# Patient Record
Sex: Male | Born: 1965 | Race: White | Hispanic: No | Marital: Married | Smoking: Never smoker
Health system: Southern US, Community
[De-identification: ages and names within clinical notes are randomized; demographics above are authoritative.]

## PROBLEM LIST (undated history)

## (undated) DIAGNOSIS — I4892 Unspecified atrial flutter: Secondary | ICD-10-CM

## (undated) DIAGNOSIS — G43909 Migraine, unspecified, not intractable, without status migrainosus: Secondary | ICD-10-CM

## (undated) DIAGNOSIS — I10 Essential (primary) hypertension: Secondary | ICD-10-CM

## (undated) DIAGNOSIS — I499 Cardiac arrhythmia, unspecified: Secondary | ICD-10-CM

## (undated) HISTORY — DX: Unspecified atrial flutter: I48.92

## (undated) HISTORY — PX: OTHER SURGICAL HISTORY: SHX169

## (undated) HISTORY — DX: Cardiac arrhythmia, unspecified: I49.9

## (undated) HISTORY — DX: Migraine, unspecified, not intractable, without status migrainosus: G43.909

## (undated) HISTORY — DX: Essential (primary) hypertension: I10

---

## 2004-01-15 ENCOUNTER — Encounter: Payer: Self-pay | Admitting: Cardiology

## 2004-01-16 ENCOUNTER — Encounter: Payer: Self-pay | Admitting: Cardiology

## 2006-05-27 ENCOUNTER — Emergency Department (HOSPITAL_COMMUNITY): Admission: EM | Admit: 2006-05-27 | Discharge: 2006-05-27 | Payer: Self-pay | Admitting: Emergency Medicine

## 2007-02-03 ENCOUNTER — Ambulatory Visit: Payer: Self-pay | Admitting: Cardiology

## 2007-02-16 ENCOUNTER — Encounter: Payer: Self-pay | Admitting: Cardiology

## 2007-02-16 ENCOUNTER — Ambulatory Visit: Payer: Self-pay | Admitting: Cardiology

## 2007-02-23 IMAGING — CT CT CERVICAL SPINE W/O CM
1 of 7 series · 3 of 14 positions shown, 4 images · IV contrast (agent unspecified)
Comparison: none

CLINICAL DATA: 40-year-old male.  Head trauma, headache and neck pain.
 HEAD CT WITHOUT CONTRAST:
TECHNIQUE: Contiguous axial images were obtained from the base of the skull through the vertex according to standard protocol without contrast.
TECHNIQUE: Multidetector CT imaging of the cervical spine was performed.  Multiplanar CT image reconstructions were also generated.

[Series 6: recon 2: cervical spine · axial · 0.31mm/px · z∈[+62,+257]mm · 3 of 79 slices shown, 4 images]
[im 1/79  soft-tissue]
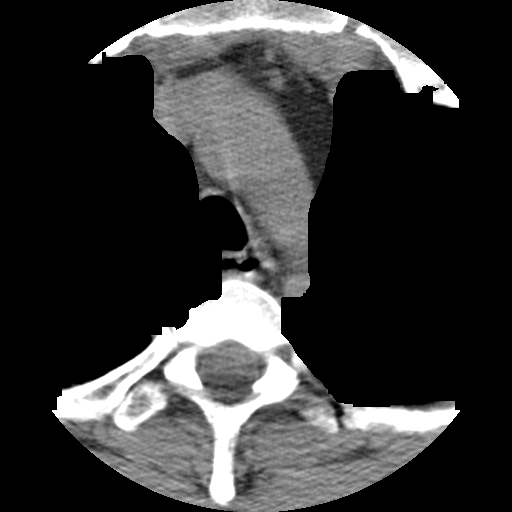
[im 1/79  bone]
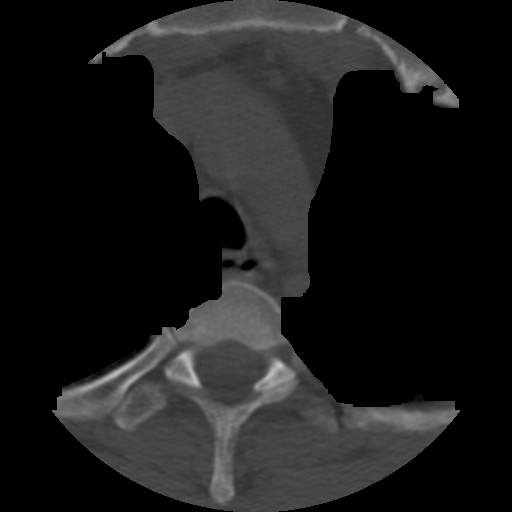
[im 40/79  bone]
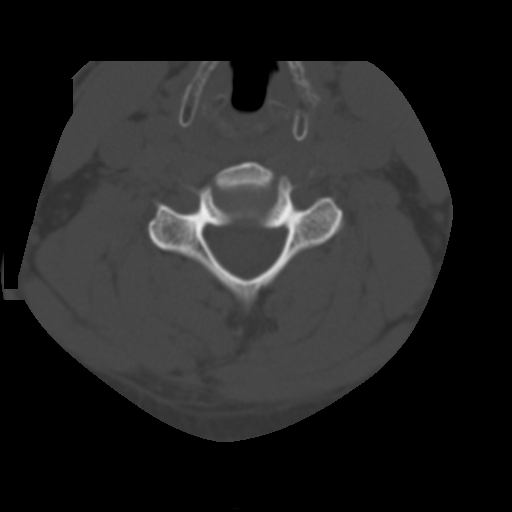
[im 79/79  bone]
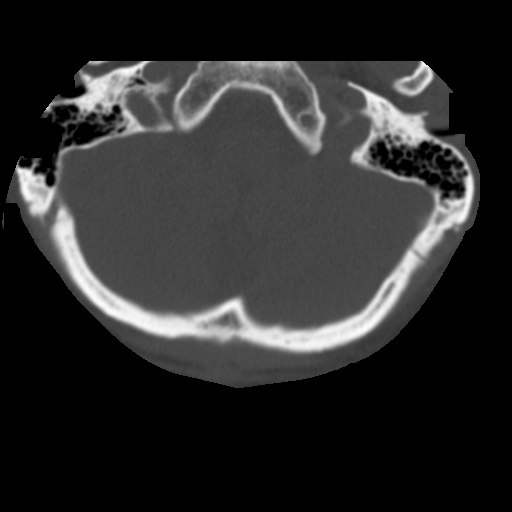

[3 of 14 positions shown; findings below may reference images not displayed]

FINDINGS: There is no evidence of intracranial hemorrhage, brain edema, acute infarct, mass lesion, or mass effect.  No other intra-axial abnormalities are seen, and the ventricles are within normal limits.  No abnormal extra-axial fluid collections or masses are identified. No skull abnormalities are noted.
IMPRESSION: Negative non-contrast head CT.
 CERVICAL SPINE CT WITHOUT CONTRAST:
FINDINGS: Cervical spine alignment is somewhat straightened with minimal spondylosis and degenerative disc disease at C5-6.  No acute compression fracture, subluxation, or dislocation.  Facets are aligned.  Foramina are patent.  Prevertebral soft tissues are normal. 
 Intact odontoid.
IMPRESSION: 1.  No acute fracture or static injury of the cervical spine by CT. 
 2.  Mild C5-6 degenerative disc disease and spondylosis.

## 2010-09-29 ENCOUNTER — Telehealth (INDEPENDENT_AMBULATORY_CARE_PROVIDER_SITE_OTHER): Payer: Self-pay | Admitting: *Deleted

## 2010-10-02 ENCOUNTER — Ambulatory Visit: Payer: Self-pay | Admitting: Cardiology

## 2010-10-02 ENCOUNTER — Encounter: Payer: Self-pay | Admitting: Cardiology

## 2010-10-02 DIAGNOSIS — I959 Hypotension, unspecified: Secondary | ICD-10-CM

## 2010-10-02 DIAGNOSIS — I4892 Unspecified atrial flutter: Secondary | ICD-10-CM

## 2010-10-02 DIAGNOSIS — R55 Syncope and collapse: Secondary | ICD-10-CM

## 2010-10-02 DIAGNOSIS — G43909 Migraine, unspecified, not intractable, without status migrainosus: Secondary | ICD-10-CM | POA: Insufficient documentation

## 2010-10-02 DIAGNOSIS — I10 Essential (primary) hypertension: Secondary | ICD-10-CM | POA: Insufficient documentation

## 2010-10-02 DIAGNOSIS — R079 Chest pain, unspecified: Secondary | ICD-10-CM

## 2010-10-08 ENCOUNTER — Ambulatory Visit: Payer: Self-pay | Admitting: Cardiology

## 2010-10-11 ENCOUNTER — Encounter: Payer: Self-pay | Admitting: Physician Assistant

## 2010-10-15 ENCOUNTER — Encounter: Payer: Self-pay | Admitting: Cardiology

## 2010-10-23 ENCOUNTER — Ambulatory Visit: Payer: Self-pay | Admitting: Physician Assistant

## 2010-11-27 NOTE — Assessment & Plan Note (Signed)
Summary: 3 WK F/U PER 12/8 OV W/[name] [name]   Visit Type:  Follow-up Primary Provider:  [name],MD   History of Present Illness: patient returns for scheduled early followup, per [name] [name].  He was recently seen here in the office for evaluation of CP, with no known history of CAD. He has previously documented atrial flutter, status post [facility], previously followed by [name] [name].   His symptoms were felt to be atypical, lasting only a few seconds in duration. He did complain of DOE, however, and was referred for a 2-D echo. He was noted to have had a normal stress echo in 2008. He was also referred for a 48-hour Holter monitor, and placed back on low-dose Toprol, which he had taken back in the past. This was to address his untreated HTN, as well as possible recurrent atrial flutter.  Echocardiogram indicated normal LVF (EF 55-60%), with no focal wall motion abnormalities. There was mildly dilated aortic root (39 mmHg). The Holter monitor yielded normal sinus rhythm with sinus tachycardia, and rare PACs; no atrial fibrillation/flutter.   Clinically, the patient has not had any recurrent chest pain. He feels much better, since starting the Toprol. He has not yet started low-dose aspirin, however, given that he is scheduled to undergo left arm surgery, next Tuesday.  Preventive Screening-Counseling & Management  Alcohol-Tobacco     Smoking Status: never  Current Medications (verified): 1)  Aspirin 81 Mg Tbec (Aspirin) .... Take One Tablet By Mouth Daily 2)  Metoprolol Succinate 25 Mg Xr24h-Tab (Metoprolol Succinate) .... Take One Tablet By Mouth Daily  Allergies (verified): No Known Drug Allergies  Comments:  Nurse/Medical Assistant: [name] medications and allergies were verbally reviewed with the patient and were updated in the Medication and Allergy Lists.  Past History:  Past Medical History: Last updated: 10/02/2010 1. Atrial flutter: Patient had atrial flutter  in 3/05.  He was drinking a large amount of caffeine at the time.  He had DCCV and has had no known recurrence.  2. Chest Pain: Stress echo (4/08): 11'13", no ischemic ST changes, normal echo stress.  3. Hypertension diag in 2004 4. Migraine Headaches  Review of Systems       No fevers, chills, hemoptysis, dysphagia, melena, hematocheezia, hematuria, rash, claudication, orthopnea, pnd, pedal edema. All other systems negative.   Vital Signs:  Patient profile:   45 year old male Height:      72 inches Weight:      206 pounds Pulse rate:   66 / minute BP sitting:   135 / 80  (left arm) Cuff size:   large  Vitals Entered By: [name] [name] (October 23, 2010 2:05 PM)  Physical Exam  Additional Exam:  GEN: 45 year old male, no distress HEENT: NCAT,PERRLA,EOMI NECK: palpable pulses, no bruits; no JVD; no TM LUNGS: CTA bilaterally HEART: RRR (S1S2); no significant murmurs; no rubs; no gallops ABD: soft, NT; intact BS EXT: intact distal pulses; no edema SKIN: warm, dry MUSC: no obvious deformity NEURO: A/O (x3)     Impression & Recommendations:  Problem # 1:  CHEST PAIN-UNSPECIFIED (ICD-786.50)  no further workup indicated. Patient recently presented with atypical symptoms, with no recurrence. He denies exertional chest discomfort. Recent echo indicated normal function. He is cleared to proceed with scheduled left arm surgery next week, and is at low risk for perioperative cardiac events. I advised him to not start low-dose aspirin, pending clearance from his surgeon.  Problem # 2:  ATRIAL FLUTTER (ICD-427.32)  no  recurrence by recent 48-hour Holter monitor. Continue low-dose Toprol.  Problem # 3:  HYPERTENSION (ICD-401.9)  improved, following recent resumption of low dose Toprol. Patient advised to monitor this in the ambulatory setting, and to follow with [name] [name], in the event he develops uncontrolled HTN.  Patient Instructions: 1)  Your physician wants you to  follow-up in: 1 year. You will receive a reminder letter in the mail one-two months in advance. If you don't receive a letter, please call our office to schedule the follow-up appointment. 2)  Your physician recommends that you continue on your current medications as directed. Please refer to the Current Medication list given to you today.

## 2010-11-27 NOTE — Assessment & Plan Note (Signed)
Summary: LAST SEEN 2008-CHEST PAIN, SURGERY 10/2010-JM   Visit Type:  Follow-up Primary Provider:  [name],MD  CC:  chest pain.  History of Present Illness: 45 yo with history of atrial flutter and DCCV in 2005 presents for evaluation of chest pain.  Last Saturday evening, he had severe pain across his left chest while walking to the car.  It occurred suddenly, but it only lasted for 2-3 seconds total and completely resolved.  It has not recurred. Since that time, he has felt short of breath walking up steps or walking up a hill.  This is new for him.  He denies palpitations.  BP is quite high at 152/100 today.  He says that it runs systolic 130s-140s at home.  He used to be on Toprol XL but stopped it about a year ago.  He drank caffeine very heavily in the past and has cut back some, but is still drinking 7-8 caffeinated beverages a day.    ECG: NSR, normal  Preventive Screening-Counseling & Management  Alcohol-Tobacco     Smoking Status: never  Current Medications (verified): 1)  None  Allergies (verified): No Known Drug Allergies  Comments:  Nurse/Medical Assistant: The patient is currently on medications but does not know the name or dosage at this time. Instructed to contact our office with details. Will update medication list at that time.  Past History:  Past Medical History: 1. Atrial flutter: Patient had atrial flutter in 3/05.  He was drinking a large amount of caffeine at the time.  He had DCCV and has had no known recurrence.  2. Chest Pain: Stress echo (4/08): 11'13", no ischemic ST changes, normal echo stress.  3. Hypertension diag in 2004 4. Migraine Headaches  Family History: Adopted by his grandmother.  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   Vital Signs:  Patient profile:   45 year old male Height:      72 inches Weight:      204 pounds BMI:     27.77 Pulse rate:   80 / minute BP sitting:   152 / 100  (left arm) Cuff size:   large  Vitals Entered By: Carlye Grippe (October 02, 2010 2:07 PM)  Nutrition Counseling: Patient's BMI is greater than 25 and therefore counseled on weight management options. CC: chest pain   Physical Exam  General:  Well developed, well nourished, in no acute distress. Head:  normocephalic and atraumatic Nose:  no deformity, discharge, inflammation, or lesions Mouth:  Teeth, gums and palate normal. Oral mucosa normal. Neck:  Neck supple, no JVD. No masses, thyromegaly or abnormal cervical nodes. Lungs:  Clear bilaterally to auscultation and percussion. Heart:  Non-displaced PMI, chest non-tender; regular rate and rhythm, S1, S2 without murmurs, rubs or gallops. Carotid upstroke normal, no bruit. Pedals normal pulses. No edema, no varicosities. Abdomen:  Bowel sounds positive; abdomen soft and non-tender without masses, organomegaly, or hernias noted. No hepatosplenomegaly. Extremities:  No clubbing or cyanosis. Neurologic:  Alert and oriented x 3. Skin:  Intact without lesions or rashes. Psych:  Normal affect.   Impression & Recommendations:  Problem # 1:  CHEST PAIN-UNSPECIFIED (ICD-786.50) Very atypical chest pain (lasted 2-3 seconds and has not recurred).  Normal stress echo in  2008.  Patient also has new dyspnea with moderate exertion.  I will get an echocardiogram to make sure that his heart is structurally normal.    Problem # 2:  ATRIAL FLUTTER (ICD-427.32) DCCV in 2005.  No definite recurrence.  In case some of his symptoms are related to an atrial flutter recurrence, I will get a 48 hour holter monitor.  Given high blood pressure, I will restart Toprol XL at 25 mg daiy.  I would also have him take an aspirin daily given history of atrial flutter.  He should try to cut back  further on caffeine.   Other Orders: EKG w/ Interpretation (93000) Holter Monitor (Holter Monitor) 2-D Echocardiogram (2D Echo)  Patient Instructions: 1)  RETURN FOR FOLLOW UP APPT WGENE SERPE, PA-C ON TUES, Oct 28, 2010 AT 10:20AM. 2)  START TOPROL (METOPROLOL) 20M GPO once daily. 3)  Your physician discussed the risks, benefits and indications for preventive aspirin therapy. It is recommended that you start (or continue) taking 81 mg of aspirin a day. 4)  Your physician has requested that you have an echocardiogram.  Echocardiography is a painless test that uses sound waves to create images of your heart. It provides your doctor with information about the size and shape of your heart and how well your heart's Stangl and valves are working.  This procedure takes approximately one hour. There are no restrictions for this procedure. 5)  Your physician has recommended that you wear a holter monitor.  Holter monitors are medical devices that record the heart's electrical activity. Doctors most often use these monitors to diagnose arrhythmias. Arrhythmias are problems with the speed or rhythm of the heartbeat. The monitor is a small, portable device. You can wear one while you do your normal daily activities. This is usually used to diagnose what is causing palpitations/syncope (passing out). Prescriptions: METOPROLOL SUCCINATE 25 MG XR24H-TAB (METOPROLOL SUCCINATE) Take one tablet by mouth daily  #30 x 6   Entered by:   Cyril Loosen, RN, BSN   Authorized by:   Marca Ancona, MD   Signed by:   Cyril Loosen, RN, BSN on 10/02/2010   Method used:   Electronically to        [facility]  [address] [id number] (retail)       [address]       [place], [place]  [address]       Ph: [phone] or [phone]       Fax: [phone]   RxID:   [id number]   Handout requested.

## 2010-11-27 NOTE — Letter (Signed)
Summary: [facility] D/C DR. Donzetta Sprung  [facility] D[facility]C DR. Donzetta Sprung   Imported By: Zachary George 10/02/2010 10:42:42  _____________________________________________________________________  External Attachment:    Type:   Image     Comment:   External Document

## 2010-11-27 NOTE — Procedures (Signed)
Summary: Holter Monitor Summary  Holter Monitor Summary   Imported By: Cyril Loosen, RN, BSN 10/15/2010 15:59:27  _____________________________________________________________________  External Attachment:    Type:   Image     Comment:   External Document  Appended Document: Holter Monitor Summary Discuss results w/pt at office visit on 12/29

## 2010-11-27 NOTE — Progress Notes (Signed)
Summary: Chest Pain  Phone Note Call from Patient Call back at [phone]   Summary of Call: Pt states he has h/o a.flutter. He states last Sat night he had pain under his (L) breast. He was walking to the car when this happened. He states the area is sore to the touch now. He also states he is a little more SOB now than he was before.   Pt has scheduled surgery on his arm planned for January 2012. He would like to be checked out before then.   Pt will be seen on Thursday, Oct 02, 2010 at 2pm by Dr. Shirlee Latch. Pt is aware to go to ER for continued or worsening CP prior to this appt. Initial call taken by: Cyril Loosen, RN, BSN,  September 29, 2010 8:57 AM

## 2011-03-13 NOTE — Assessment & Plan Note (Signed)
[facility]                          [facility] OFFICE NOTE   NAME:[name] [name] REAM                      MRN:          [id number]  DATE:02/03/2007                            DOB:          December 31, 1965    PRIMARY CARE PHYSICIAN:  Donzetta Sprung, M.D.   REASON FOR VISIT:  Follow up atrial flutter.   HISTORY OF PRESENT ILLNESS:  [name] [name] was in the office back in  April 2005.  He has a history of atrial flutter, diagnosed at that time  that required electrical cardioversion.  He had no major structural  cardiac abnormalities by echocardiography and has been managed with low  dose Toprol XL.  He takes this on an intermittent basis and does report  occasional palpitations lasting no more than 10 seconds, but nothing  prolonged over the last 3 years.  Otherwise, symptomatically, he  describes an occasional left-sided chest pain that is sporadic and  somewhat atypical.  He has not had an ischemic evaluation.   ALLERGIES:  No known drug allergies.   PRESENT MEDICATIONS:  Metoprolol 25 mg p.o. daily.   REVIEW OF SYSTEMS:  As described in history of present illness.   EXAMINATION:  Blood pressure is 129/84, heart rate is 65.  He weighs 200  pounds.  The patient is comfortable in no acute distress.  HEENT:  Conjunctivae is normal.  Oropharynx is clear.  NECK:  Is supple without elevated jugular pressure. No bruits, no  thyromegaly is noted.  LUNGS:  Are clear without labored breathing at rest.  CARDIAC:  Exam reveals a regular rate and rhythm.  No loud murmur or S3  gallop, no pericardial rub.  ABDOMEN:  Soft, nontender.  EXTREMITIES:  Show no pitting edema.  SKIN:  Is warm and dry.  Distal pulses 2+.  MUSCULOSKELETAL:  No kyphosis noted.  NEUROPSYCHIATRIC:  Patient alert and oriented x3.   Electrocardiogram today shows sinus rhythm with an incomplete right  bundle branch block pattern.   IMPRESSION/RECOMMENDATION:  1. A history of atrial  flutter, relatively quiescent on intermittent      beta blocker therapy.  I have reminded him about taking a coated      aspirin daily and at this point we will continue a strategy of      observation.  Certainly if he has any major recurrences, an      electrophysiology consultation could be obtained.  2. Sporadic atypical chest pain.  [name] [name] has not had an ischemic      evaluation as yet      and we will plan a baseline exercise echocardiogram off of his      Toprol.  We will let him know of the results and can proceed from      there.     Jonelle Sidle, MD  Electronically Signed    SGM/MedQ  DD: 02/03/2007  DT: 02/03/2007  Job #: [id number]   cc:   Donzetta Sprung

## 2011-07-27 ENCOUNTER — Other Ambulatory Visit: Payer: Self-pay | Admitting: *Deleted

## 2011-07-27 MED ORDER — METOPROLOL SUCCINATE ER 25 MG PO TB24: 25.0000 mg | ORAL_TABLET | Freq: Every day | ORAL | Status: DC

## 2012-07-05 ENCOUNTER — Other Ambulatory Visit: Payer: Self-pay | Admitting: *Deleted

## 2012-07-05 MED ORDER — METOPROLOL SUCCINATE ER 25 MG PO TB24: 25.0000 mg | ORAL_TABLET | Freq: Every day | ORAL | Status: DC

## 2012-08-26 ENCOUNTER — Other Ambulatory Visit: Payer: Self-pay | Admitting: *Deleted

## 2012-08-26 MED ORDER — METOPROLOL SUCCINATE ER 25 MG PO TB24: 25.0000 mg | ORAL_TABLET | Freq: Every day | ORAL | Status: DC

## 2016-11-25 DIAGNOSIS — L719 Rosacea, unspecified: Secondary | ICD-10-CM | POA: Diagnosis not present

## 2017-03-05 DIAGNOSIS — S92411A Displaced fracture of proximal phalanx of right great toe, initial encounter for closed fracture: Secondary | ICD-10-CM | POA: Diagnosis not present

## 2017-03-05 DIAGNOSIS — S92901A Unspecified fracture of right foot, initial encounter for closed fracture: Secondary | ICD-10-CM | POA: Diagnosis not present

## 2017-03-05 DIAGNOSIS — M79671 Pain in right foot: Secondary | ICD-10-CM | POA: Diagnosis not present

## 2017-03-05 DIAGNOSIS — S99911A Unspecified injury of right ankle, initial encounter: Secondary | ICD-10-CM | POA: Diagnosis not present

## 2017-03-05 DIAGNOSIS — S93401A Sprain of unspecified ligament of right ankle, initial encounter: Secondary | ICD-10-CM | POA: Diagnosis not present

## 2017-03-05 DIAGNOSIS — W11XXXA Fall on and from ladder, initial encounter: Secondary | ICD-10-CM | POA: Diagnosis not present

## 2017-05-04 DIAGNOSIS — L0100 Impetigo, unspecified: Secondary | ICD-10-CM | POA: Diagnosis not present

## 2017-05-04 DIAGNOSIS — Q828 Other specified congenital malformations of skin: Secondary | ICD-10-CM | POA: Diagnosis not present

## 2017-06-22 DIAGNOSIS — L0100 Impetigo, unspecified: Secondary | ICD-10-CM | POA: Diagnosis not present

## 2017-06-22 DIAGNOSIS — Q828 Other specified congenital malformations of skin: Secondary | ICD-10-CM | POA: Diagnosis not present

## 2017-10-06 DIAGNOSIS — Q828 Other specified congenital malformations of skin: Secondary | ICD-10-CM | POA: Diagnosis not present

## 2018-01-03 DIAGNOSIS — R51 Headache: Secondary | ICD-10-CM | POA: Diagnosis not present

## 2018-01-03 DIAGNOSIS — I10 Essential (primary) hypertension: Secondary | ICD-10-CM | POA: Diagnosis not present

## 2018-01-27 DIAGNOSIS — I10 Essential (primary) hypertension: Secondary | ICD-10-CM | POA: Diagnosis not present

## 2018-01-27 DIAGNOSIS — R51 Headache: Secondary | ICD-10-CM | POA: Diagnosis not present

## 2018-01-27 DIAGNOSIS — Z6826 Body mass index (BMI) 26.0-26.9, adult: Secondary | ICD-10-CM | POA: Diagnosis not present

## 2018-02-21 DIAGNOSIS — D229 Melanocytic nevi, unspecified: Secondary | ICD-10-CM | POA: Diagnosis not present

## 2018-02-21 DIAGNOSIS — D485 Neoplasm of uncertain behavior of skin: Secondary | ICD-10-CM | POA: Diagnosis not present

## 2018-02-21 DIAGNOSIS — Q828 Other specified congenital malformations of skin: Secondary | ICD-10-CM | POA: Diagnosis not present

## 2018-07-20 DIAGNOSIS — Z6827 Body mass index (BMI) 27.0-27.9, adult: Secondary | ICD-10-CM | POA: Diagnosis not present

## 2018-07-20 DIAGNOSIS — J209 Acute bronchitis, unspecified: Secondary | ICD-10-CM | POA: Diagnosis not present

## 2018-09-06 DIAGNOSIS — Z1211 Encounter for screening for malignant neoplasm of colon: Secondary | ICD-10-CM | POA: Diagnosis not present

## 2018-09-30 DIAGNOSIS — Z1211 Encounter for screening for malignant neoplasm of colon: Secondary | ICD-10-CM | POA: Diagnosis not present

## 2018-09-30 DIAGNOSIS — M5136 Other intervertebral disc degeneration, lumbar region: Secondary | ICD-10-CM | POA: Diagnosis not present

## 2018-09-30 DIAGNOSIS — I7300 Raynaud's syndrome without gangrene: Secondary | ICD-10-CM | POA: Diagnosis not present

## 2018-11-09 DIAGNOSIS — Q828 Other specified congenital malformations of skin: Secondary | ICD-10-CM | POA: Diagnosis not present

## 2018-12-27 DIAGNOSIS — I10 Essential (primary) hypertension: Secondary | ICD-10-CM | POA: Diagnosis not present

## 2018-12-27 DIAGNOSIS — Z6828 Body mass index (BMI) 28.0-28.9, adult: Secondary | ICD-10-CM | POA: Diagnosis not present

## 2019-01-24 DIAGNOSIS — I10 Essential (primary) hypertension: Secondary | ICD-10-CM | POA: Diagnosis not present

## 2020-07-17 ENCOUNTER — Ambulatory Visit: Payer: 59 | Admitting: Dermatology

## 2020-07-17 ENCOUNTER — Encounter: Payer: Self-pay | Admitting: Dermatology

## 2020-07-17 ENCOUNTER — Other Ambulatory Visit: Payer: Self-pay

## 2020-07-17 DIAGNOSIS — Z1283 Encounter for screening for malignant neoplasm of skin: Secondary | ICD-10-CM | POA: Diagnosis not present

## 2020-07-17 DIAGNOSIS — L929 Granulomatous disorder of the skin and subcutaneous tissue, unspecified: Secondary | ICD-10-CM | POA: Diagnosis not present

## 2020-07-17 DIAGNOSIS — Q828 Other specified congenital malformations of skin: Secondary | ICD-10-CM

## 2020-07-17 DIAGNOSIS — W57XXXA Bitten or stung by nonvenomous insect and other nonvenomous arthropods, initial encounter: Secondary | ICD-10-CM

## 2020-07-17 DIAGNOSIS — Z5181 Encounter for therapeutic drug level monitoring: Secondary | ICD-10-CM

## 2020-07-17 DIAGNOSIS — S0086XA Insect bite (nonvenomous) of other part of head, initial encounter: Secondary | ICD-10-CM

## 2020-07-17 MED ORDER — ACITRETIN 25 MG PO CAPS: 25.0000 mg | ORAL_CAPSULE | Freq: Every day | ORAL | 2 refills | Status: AC

## 2020-07-17 NOTE — Patient Instructions (Addendum)
Routine follow-up for [name] [name] whose chronic area days has been under fairly good control with 20,000 unit over-the-counter vitamin A plus regular tanning bed visits.  Skin examination from waist up showed no atypical moles.  Hysteria uses mostly nonvesicular and fairly flat and under fair control.  He does have an eroded papule on the right nasal crease which he is quite positive is a location that he had had a tick bite which triggered the lesion.  I told him I could not tell a tick bite granuloma from a basal cell cancer since I rarely see tick bites on the face, but he promised to return both to recheck his lab and to recheck the right nostril in a couple of months.  If there is still a lesion there, we will obtain biopsy and I have already discussed the possibility of Mohs surgery should it proved to be a skin cancer.  We are choosing to discontinue the vitamin A and begin therapy with generic Soriatane 25 mg once daily this should be taken with your largest meal of the day if you miss a dose do not double up the next day.  Possible side effects would be some dryness to your lips or your skin, some fragility to nails or your skin, elevated triglyceride, infrequently achiness or headache or hair shedding.  He knows if he has any questions in the interim he can call me.

## 2020-07-19 LAB — LIPID PANEL
Cholesterol: 173 mg/dL (ref <200)
HDL: 43 mg/dL (ref >=40)
LDL Cholesterol (Calc): 107 mg/dL (calc) — ABNORMAL HIGH
Non-HDL Cholesterol (Calc): 130 mg/dL (calc) — ABNORMAL HIGH (ref <130)
Total CHOL/HDL Ratio: 4 (calc) (ref <5.0)
Triglycerides: 118 mg/dL (ref <150)

## 2020-07-19 LAB — COMPREHENSIVE METABOLIC PANEL
AG Ratio: 2.1 (calc) (ref 1.0–2.5)
ALT: 39 U/L (ref 9–46)
AST: 25 U/L (ref 10–35)
Albumin: 4.6 g/dL (ref 3.6–5.1)
Alkaline phosphatase (APISO): 71 U/L (ref 35–144)
BUN: 13 mg/dL (ref 7–25)
CO2: 30 mmol/L (ref 20–32)
Calcium: 9.8 mg/dL (ref 8.6–10.3)
Chloride: 103 mmol/L (ref 98–110)
Creat: 1.1 mg/dL (ref 0.70–1.33)
Globulin: 2.2 g/dL (calc) (ref 1.9–3.7)
Glucose, Bld: 88 mg/dL (ref 65–99)
Potassium: 4.3 mmol/L (ref 3.5–5.3)
Sodium: 140 mmol/L (ref 135–146)
Total Bilirubin: 1 mg/dL (ref 0.2–1.2)
Total Protein: 6.8 g/dL (ref 6.1–8.1)

## 2020-08-15 NOTE — Progress Notes (Signed)
   Follow-Up Visit   Subjective  [name] [name] is a 54 y.o. male who presents for the following: Annual Exam (any new treatment for dariers disease on right side of body? also refill tretinoin 0.1%).  Annual skin exam Location:  Duration:  Quality:  Associated Signs/Symptoms: Modifying Factors:  Severity:  Timing: Context:   Objective  Well appearing patient in no apparent distress; mood and affect are within normal limits.  A full examination was performed including scalp, head, eyes, ears, nose, lips, neck, chest, axillae, abdomen, back, buttocks, bilateral upper extremities, bilateral lower extremities, hands, feet, fingers, toes, fingernails, and toenails. All findings within normal limits unless otherwise noted below.   Assessment & Plan    Routine follow-up for [name] [name] whose chronic area days has been under fairly good control with 20,000 unit over-the-counter vitamin A plus regular tanning bed visits.  Skin examination from waist up showed no atypical moles.  Hysteria uses mostly nonvesicular and fairly flat and under fair control.  He does have an eroded papule on the right nasal crease which he is quite positive is a location that he had had a tick bite which triggered the lesion.  I told him I could not tell a tick bite granuloma from a basal cell cancer since I rarely see tick bites on the face, but he promised to return both to recheck his lab and to recheck the right nostril in a couple of months.  If there is still a lesion there, we will obtain biopsy and I have already discussed the possibility of Mohs surgery should it proved to be a skin cancer.  We are choosing to discontinue the vitamin A and begin therapy with generic Soriatane 25 mg once daily this should be taken with your largest meal of the day if you miss a dose do not double up the next day.  Possible side effects would be some dryness to your lips or your skin, some fragility to nails or your skin, elevated  triglyceride, infrequently achiness or headache or hair shedding.  He knows if he has any questions in the interim he can call me.   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   I, Lavonna Monarch, MD, have reviewed all documentation for this visit.  The documentation on 08/19/20 for the exam, diagnosis, procedures, and orders are all accurate and complete.

## 2020-08-19 ENCOUNTER — Encounter: Payer: Self-pay | Admitting: Dermatology

## 2020-09-25 ENCOUNTER — Encounter: Payer: Self-pay | Admitting: Dermatology

## 2020-09-25 ENCOUNTER — Ambulatory Visit: Payer: 59 | Admitting: Dermatology

## 2020-09-25 ENCOUNTER — Other Ambulatory Visit: Payer: Self-pay

## 2020-09-25 DIAGNOSIS — Q828 Other specified congenital malformations of skin: Secondary | ICD-10-CM

## 2020-09-25 DIAGNOSIS — Z5181 Encounter for therapeutic drug level monitoring: Secondary | ICD-10-CM | POA: Diagnosis not present

## 2020-09-25 NOTE — Progress Notes (Signed)
TANNING BED USE NO BURN

## 2020-09-28 ENCOUNTER — Encounter: Payer: Self-pay | Admitting: Dermatology

## 2020-09-28 NOTE — Progress Notes (Signed)
   Follow-Up Visit   Subjective  [name] [name] is a 54 y.o. male who presents for the following: Follow-up (dariers disease= much better no itch tx- soriatane 25mg  qd).  Darier's disease Location: Torso Duration: Decades Quality: Clearest he has ever been Associated Signs/Symptoms: Modifying Factors: Soriatane Severity:  Timing: Context:   Objective  Well appearing patient in no apparent distress; mood and affect are within normal limits.  All skin waist up examined.   Assessment & Plan    Darier disease Right Breast  Continue 25 mg Soriatane daily.  Check fasting lipids.  If doing quite well in 3 months, he will have the option of trying to cut to 10 mg Soriatane daily.  Follow-up at that time.  Other Related Procedures CMP Lipid Panel  Encounter for therapeutic drug monitoring  Other Related Procedures CMP Lipid Panel     I, Lavonna Monarch, MD, have reviewed all documentation for this visit.  The documentation on 09/28/20 for the exam, diagnosis, procedures, and orders are all accurate and complete.

## 2020-11-01 ENCOUNTER — Other Ambulatory Visit: Payer: Self-pay | Admitting: Dermatology

## 2020-11-01 DIAGNOSIS — Q828 Other specified congenital malformations of skin: Secondary | ICD-10-CM

## 2020-11-01 DIAGNOSIS — Z5181 Encounter for therapeutic drug level monitoring: Secondary | ICD-10-CM

## 2020-12-24 ENCOUNTER — Ambulatory Visit: Payer: 59 | Admitting: Dermatology

## 2021-01-30 ENCOUNTER — Ambulatory Visit: Payer: 59 | Admitting: Dermatology

## 2021-01-30 ENCOUNTER — Other Ambulatory Visit: Payer: Self-pay

## 2021-01-30 ENCOUNTER — Encounter: Payer: Self-pay | Admitting: Dermatology

## 2021-01-30 DIAGNOSIS — Q828 Other specified congenital malformations of skin: Secondary | ICD-10-CM | POA: Diagnosis not present

## 2021-01-30 DIAGNOSIS — Z5181 Encounter for therapeutic drug level monitoring: Secondary | ICD-10-CM | POA: Diagnosis not present

## 2021-01-30 DIAGNOSIS — D485 Neoplasm of uncertain behavior of skin: Secondary | ICD-10-CM

## 2021-01-30 NOTE — Patient Instructions (Signed)

## 2021-02-10 ENCOUNTER — Encounter: Payer: Self-pay | Admitting: Dermatology

## 2021-02-10 NOTE — Progress Notes (Signed)
   Follow-Up Visit   Subjective  [name] [name] is a 55 y.o. male who presents for the following: Follow-up (Patient here today for follow up for Darier disease per patient current treatment of Soriatane is helping.  Per patient he does have a red lesion on his right upper arm x years that he would like removed. Per patient it's larger, no pain, it did bleed when it got caught on his t-shirt.).  Follow-up Darier's's disease, new growth right arm Location:  Duration:  Quality:  Associated Signs/Symptoms: Modifying Factors:  Severity:  Timing: Context:   Objective  Well appearing patient in no apparent distress; mood and affect are within normal limits. Objective  Right Upper Arm - Posterior: Ulcerated red exophytic 5 mm papule; suspect pyogenic granuloma.     Objective  Left Breast: Patient is overall pleased with how well he did so far on low-dose Soriatane.  All side effects again reviewed along with the need for a simple lab screen 1-2 times yearly.    A focused examination was performed including Arms and parts of torso.. Relevant physical exam findings are noted in the Assessment and Plan.   Assessment & Plan    Neoplasm of uncertain behavior of skin Right Upper Arm - Posterior  Skin / nail biopsy Type of biopsy: tangential   Informed consent: discussed and consent obtained   Timeout: patient name, date of birth, surgical site, and procedure verified   Procedure prep:  Patient was prepped and draped in usual sterile fashion (Non sterile) Prep type:  Chlorhexidine Anesthesia: the lesion was anesthetized in a standard fashion   Anesthetic:  1% lidocaine w/ epinephrine 1-100,000 local infiltration Instrument used: flexible razor blade   Outcome: patient tolerated procedure well   Post-procedure details: wound care instructions given    Specimen 1 - Surgical pathology Differential Diagnosis: bcc vs scc Check Margins: No  Shave biopsy showed 1 central bleeder  which was cauterized.  Encounter for therapeutic drug monitoring  Other Related Procedures Lipid Panel  Darier's disease Left Breast  Will continue 25 mg generic Soriatane daily.  To call me with any questions or problems.      I, Lavonna Monarch, MD, have reviewed all documentation for this visit.  The documentation on 02/10/21 for the exam, diagnosis, procedures, and orders are all accurate and complete.

## 2021-03-11 ENCOUNTER — Other Ambulatory Visit: Payer: Self-pay | Admitting: Dermatology

## 2021-03-11 DIAGNOSIS — Q828 Other specified congenital malformations of skin: Secondary | ICD-10-CM

## 2021-03-11 DIAGNOSIS — Z5181 Encounter for therapeutic drug level monitoring: Secondary | ICD-10-CM

## 2021-08-04 ENCOUNTER — Ambulatory Visit: Payer: 59 | Admitting: Dermatology

## 2021-08-06 ENCOUNTER — Other Ambulatory Visit: Payer: Self-pay | Admitting: Dermatology

## 2021-08-06 DIAGNOSIS — Z5181 Encounter for therapeutic drug level monitoring: Secondary | ICD-10-CM

## 2021-08-06 DIAGNOSIS — Q828 Other specified congenital malformations of skin: Secondary | ICD-10-CM

## 2022-05-04 ENCOUNTER — Other Ambulatory Visit: Payer: Self-pay | Admitting: Dermatology

## 2022-05-04 DIAGNOSIS — Z5181 Encounter for therapeutic drug level monitoring: Secondary | ICD-10-CM

## 2022-05-04 DIAGNOSIS — Q828 Other specified congenital malformations of skin: Secondary | ICD-10-CM

## 2022-06-17 ENCOUNTER — Telehealth: Payer: Self-pay | Admitting: *Deleted

## 2022-06-17 DIAGNOSIS — Z5181 Encounter for therapeutic drug level monitoring: Secondary | ICD-10-CM

## 2022-06-17 DIAGNOSIS — Q828 Other specified congenital malformations of skin: Secondary | ICD-10-CM

## 2022-06-17 MED ORDER — ACITRETIN 25 MG PO CAPS: ORAL_CAPSULE | ORAL | 2 refills | Status: AC

## 2022-06-17 NOTE — Telephone Encounter (Signed)
Patient needed refill on his Acitretin.

## 2024-06-15 ENCOUNTER — Ambulatory Visit (INDEPENDENT_AMBULATORY_CARE_PROVIDER_SITE_OTHER): Admitting: Orthopaedic Surgery

## 2024-06-15 ENCOUNTER — Encounter: Payer: Self-pay | Admitting: Orthopaedic Surgery

## 2024-06-15 ENCOUNTER — Other Ambulatory Visit (INDEPENDENT_AMBULATORY_CARE_PROVIDER_SITE_OTHER)

## 2024-06-15 DIAGNOSIS — M25561 Pain in right knee: Secondary | ICD-10-CM

## 2024-06-15 DIAGNOSIS — G8929 Other chronic pain: Secondary | ICD-10-CM | POA: Diagnosis not present

## 2024-06-15 DIAGNOSIS — M25562 Pain in left knee: Secondary | ICD-10-CM

## 2024-06-15 MED ORDER — NAPROXEN 500 MG PO TABS: 500.0000 mg | ORAL_TABLET | Freq: Two times a day (BID) | ORAL | 5 refills | Status: AC

## 2024-06-15 NOTE — Progress Notes (Signed)
 Subjective:    Patient ID: [name] [name], male    DOB: Dec 15, 1965, 58 y.o.   MRN: [id number]  HPI He has bilateral knee pain more on the left with giving way of the left knee.  He has some swelling and some popping but more medial pain in both knees.  He has no trauma, no redness, no numbness.  It has been getting worse over the last six weeks or so.  He has tried ibuprofen and some ice with no help.  He has no other joint pains.   Review of Systems  Constitutional:  Positive for activity change.  Cardiovascular:  Positive for palpitations.  Musculoskeletal:  Positive for arthralgias, gait problem, joint swelling and myalgias.  Neurological:  Positive for headaches.  All other systems reviewed and are negative. For Review of Systems, all other systems reviewed and are negative.  The following is a summary of the past history medically, past history surgically, known current medicines, social history and family history.  This information is gathered electronically by the computer from prior information and documentation.  I review this each visit and have found including this information at this point in the chart is beneficial and informative.   Past Medical History:  Diagnosis Date   Arrhythmia    afib   Atrial flutter (HCC)    Chest pain    Hypertension    Migraine headache     Past Surgical History:  Procedure Laterality Date   dccv      Current Outpatient Medications on File Prior to Visit  Medication Sig Dispense Refill   acitretin  (SORIATANE ) 25 MG capsule TAKE 1 CAPSULE BY MOUTH EVERY DAY 30 capsule 2   ibuprofen (ADVIL) 600 MG tablet Take by mouth.     No current facility-administered medications on file prior to visit.    Social History   Socioeconomic History   Marital status: Married    Spouse name: Not on file   Number of children: Not on file   Years of education: Not on file   Highest education level: Not on file  Occupational History   Occupation:  Personnel officer and Patent attorney: [employer]  Tobacco Use   Smoking status: Never   Smokeless tobacco: Never  Vaping Use   Vaping status: Never Used  Substance and Sexual Activity   Alcohol use: Yes   Drug use: Never   Sexual activity: Not on file  Other Topics Concern   Not on file  Social History Narrative   Not on file   Social Drivers of Health   Financial Resource Strain: Not on file  Food Insecurity: Not on file  Transportation Needs: Not on file  Physical Activity: Not on file  Stress: Not on file  Social Connections: Not on file  Intimate Partner Violence: Not on file    Family History  Problem Relation Age of Onset   Heart attack Maternal Uncle    Heart attack Paternal Uncle    Heart attack Maternal Grandfather     There were no vitals taken for this visit.  There is no height or weight on file to calculate BMI.      Objective:   Physical Exam Vitals and nursing note reviewed. Exam conducted with a chaperone present.  Constitutional:      Appearance: He is well-developed.  HENT:     Head: Normocephalic and atraumatic.  Eyes:     Conjunctiva/sclera: Conjunctivae normal.     Pupils: Pupils are  equal, round, and reactive to light.  Cardiovascular:     Rate and Rhythm: Normal rate and regular rhythm.  Pulmonary:     Effort: Pulmonary effort is normal.  Abdominal:     Palpations: Abdomen is soft.  Musculoskeletal:     Cervical back: Normal range of motion and neck supple.       Legs:  Skin:    General: Skin is warm and dry.  Neurological:     Mental Status: He is alert and oriented to person, place, and time.     Cranial Nerves: No cranial nerve deficit.     Motor: No abnormal muscle tone.     Coordination: Coordination normal.     Deep Tendon Reflexes: Reflexes are normal and symmetric. Reflexes normal.  Psychiatric:        Behavior: Behavior normal.        Thought Content: Thought content normal.        Judgment: Judgment normal.   X-rays were  done of both knees, reported separately.        Assessment & Plan:   Encounter Diagnosis  Name Primary?   Bilateral chronic knee pain Yes   PROCEDURE NOTE:  The patient requests injections of the right knee , verbal consent was obtained.  The right knee was prepped appropriately after time out was performed.   Sterile technique was observed and injection of 1 cc of DepoMedrol 40mg  with several cc's of plain xylocaine . Anesthesia was provided by ethyl chloride and a 20-gauge needle was used to inject the knee area. The injection was tolerated well.  A band aid dressing was applied.  The patient was advised to apply ice later today and tomorrow to the injection sight as needed.  PROCEDURE NOTE:  The patient requests injections of the left knee , verbal consent was obtained.  The left knee was prepped appropriately after time out was performed.   Sterile technique was observed and injection of 1 cc of DepoMedrol 40 mg with several cc's of plain xylocaine . Anesthesia was provided by ethyl chloride and a 20-gauge needle was used to inject the knee area. The injection was tolerated well.  A band aid dressing was applied.  The patient was advised to apply ice later today and tomorrow to the injection sight as needed.  I will begin Naprosyn  500 po bid pc.  Return in two weeks.  He may need MRI of the left knee.  I have informed the patient I will be retiring from medical practice and from this office on July 27, 2024.  The patient has been offered continuing care with Dr. Margrette or Dr. Onesimo of this office.  The patient may choose another provider and the records will be forwarded after proper signature and notification.  Patient understands and agrees.  Call if any problem.  Precautions discussed.  Electronically Signed Lemond Stable, MD 8/21/20259:27 AM

## 2024-06-29 ENCOUNTER — Encounter: Payer: Self-pay | Admitting: Orthopaedic Surgery

## 2024-06-29 ENCOUNTER — Other Ambulatory Visit (INDEPENDENT_AMBULATORY_CARE_PROVIDER_SITE_OTHER): Payer: Self-pay

## 2024-06-29 ENCOUNTER — Ambulatory Visit (INDEPENDENT_AMBULATORY_CARE_PROVIDER_SITE_OTHER): Admitting: Orthopaedic Surgery

## 2024-06-29 DIAGNOSIS — M25562 Pain in left knee: Secondary | ICD-10-CM

## 2024-06-29 DIAGNOSIS — M25561 Pain in right knee: Secondary | ICD-10-CM

## 2024-06-29 DIAGNOSIS — G8929 Other chronic pain: Secondary | ICD-10-CM

## 2024-06-29 NOTE — Addendum Note (Signed)
 Addended by: TRINDA DEANE HERO on: 06/29/2024 11:26 AM   Modules accepted: Orders

## 2024-06-29 NOTE — Progress Notes (Signed)
 My knees are a little better but the left knee is more of a problem.  He had good results from the injections last time but the left knee is having problems of giving way and several episodes of locking.  He has more medial pain.  He has no trauma.  He has swelling and popping. He is taking the naprosyn .  I am concerned about the giving way and locking.  I will get MRI of the left knee. I am concerned about a meniscus tear.  Both knees have some crepitus and the left knee has more of an effusion, ROM of right knee is 0 to 110, left knee 0 to 105 with more medial pain.  Positive medial McMurray on the left knee.  Limp to the left knee.  NV intact,  no distal edema.  Encounter Diagnosis  Name Primary?   Bilateral chronic knee pain Yes   Get MRI of the left knee.  Return in one week.  Call if any problem.  Precautions discussed.  Electronically Signed Lemond Stable, MD 9/4/20259:08 AM

## 2024-07-04 ENCOUNTER — Other Ambulatory Visit (HOSPITAL_COMMUNITY): Payer: Self-pay | Admitting: Orthopaedic Surgery

## 2024-07-04 DIAGNOSIS — G8929 Other chronic pain: Secondary | ICD-10-CM

## 2024-07-06 ENCOUNTER — Ambulatory Visit: Admitting: Orthopaedic Surgery

## 2024-07-06 ENCOUNTER — Ambulatory Visit (HOSPITAL_COMMUNITY)
Admission: RE | Admit: 2024-07-06 | Discharge: 2024-07-06 | Discharge status: Home / Self Care | Source: Ambulatory Visit | Attending: Orthopaedic Surgery | Admitting: Orthopaedic Surgery

## 2024-07-06 ENCOUNTER — Ambulatory Visit (HOSPITAL_COMMUNITY)
Admission: RE | Admit: 2024-07-06 | Discharge: 2024-07-06 | Disposition: A | Discharge status: Home / Self Care | Source: Ambulatory Visit | Attending: Orthopaedic Surgery | Admitting: Orthopaedic Surgery

## 2024-07-06 DIAGNOSIS — M25562 Pain in left knee: Secondary | ICD-10-CM | POA: Diagnosis present

## 2024-07-06 DIAGNOSIS — G8929 Other chronic pain: Secondary | ICD-10-CM | POA: Insufficient documentation

## 2024-07-06 DIAGNOSIS — M25561 Pain in right knee: Secondary | ICD-10-CM | POA: Insufficient documentation

## 2024-07-20 ENCOUNTER — Encounter: Payer: Self-pay | Admitting: Orthopaedic Surgery

## 2024-07-20 ENCOUNTER — Ambulatory Visit (INDEPENDENT_AMBULATORY_CARE_PROVIDER_SITE_OTHER): Admitting: Orthopaedic Surgery

## 2024-07-20 DIAGNOSIS — G8929 Other chronic pain: Secondary | ICD-10-CM

## 2024-07-20 DIAGNOSIS — M25562 Pain in left knee: Secondary | ICD-10-CM | POA: Diagnosis not present

## 2024-07-20 DIAGNOSIS — M25561 Pain in right knee: Secondary | ICD-10-CM

## 2024-07-20 NOTE — Progress Notes (Signed)
 My knee is tender  His left knee still has medial pain.  It is not as intense in the past and he has less swelling.  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 There is a small reactive joint effusion.   There is signal at the junction of the posterior horn and body of the medial meniscus could be artifactual or represent a small oblique tear. This is likely of no clinical significance. No displaced tear is present.  I have explained the findings to him.  I would not suggest surgery at the present.  I have independently reviewed the MRI.    He has taken the Naprosyn  and saw little help.  He takes an occasional Advil and that helps more.  He has minimal effusion today and full ROM and no limp.  NV intact.  Encounter Diagnosis  Name Primary?   Bilateral chronic knee pain Yes   Continue the Advil as needed.  Observation for now.  Return prn.  I have informed the patient I will be retiring from medical practice and from this office on July 27, 2024.  The patient has been offered continuing care with Dr. Margrette or Dr. Onesimo of this office.  The patient may choose another provider and the records will be forwarded after proper signature and notification.  Patient understands and agrees.  Call if any problem.  Precautions discussed.  Electronically Signed Lemond Stable, MD 9/25/20258:49 AM

## 2024-08-25 ENCOUNTER — Ambulatory Visit: Admitting: Urology

## 2024-08-25 VITALS — BP 151/90 | HR 71

## 2024-08-25 DIAGNOSIS — E291 Testicular hypofunction: Secondary | ICD-10-CM | POA: Diagnosis not present

## 2024-08-25 DIAGNOSIS — N529 Male erectile dysfunction, unspecified: Secondary | ICD-10-CM

## 2024-08-25 MED ORDER — TADALAFIL 20 MG PO TABS: 20.0000 mg | ORAL_TABLET | ORAL | 5 refills | Status: AC | PRN

## 2024-08-25 NOTE — Progress Notes (Signed)
 08/25/2024 12:31 PM   [name] [name] 1966/07/19 [id number]  Referring provider: Toribio Jerel MATSU, MD [address] [place],  [place] [address]  Erectile dysfunction   HPI: [name] is a 58yo here for evaluation of erectile dysfunction. He has noted issues getting and maintaining an erection for the past 8-9 months. He also has decreased libido. No prior testosterone levels. He tried tadalafil 20mg  which did improve his erectile dysfunction but caused a headache. Good exercise tolerance.    PMH: Past Medical History:  Diagnosis Date   Arrhythmia    afib   Atrial flutter (HCC)    Chest pain    Hypertension    Migraine headache     Surgical History: Past Surgical History:  Procedure Laterality Date   dccv      Home Medications:  Allergies as of 08/25/2024   No Known Allergies      Medication List        Accurate as of August 25, 2024 12:31 PM. If you have any questions, ask your nurse or doctor.          acitretin  25 MG capsule Commonly known as: SORIATANE  TAKE 1 CAPSULE BY MOUTH EVERY DAY   ibuprofen 600 MG tablet Commonly known as: ADVIL Take by mouth.   naproxen  500 MG tablet Commonly known as: NAPROSYN  Take 1 tablet (500 mg total) by mouth 2 (two) times daily with a meal.        Allergies: No Known Allergies  Family History: Family History  Problem Relation Age of Onset   Heart attack Maternal Uncle    Heart attack Paternal Uncle    Heart attack Maternal Grandfather     Social History:  reports that he has never smoked. He has never used smokeless tobacco. He reports current alcohol use. He reports that he does not use drugs.  ROS: All other review of systems were reviewed and are negative except what is noted above in HPI  Physical Exam: BP (!) 151/90   Pulse 71   Constitutional:  Alert and oriented, No acute distress. HEENT: [place] AT, moist mucus membranes.  Trachea midline, no masses. Cardiovascular: No clubbing, cyanosis, or  edema. Respiratory: Normal respiratory effort, no increased work of breathing. GI: Abdomen is soft, nontender, nondistended, no abdominal masses GU: No CVA tenderness.  Lymph: No cervical or inguinal lymphadenopathy. Skin: No rashes, bruises or suspicious lesions. Neurologic: Grossly intact, no focal deficits, moving all 4 extremities. Psychiatric: Normal mood and affect.  Laboratory Data: No results found for: WBC, HGB, HCT, MCV, PLT  Lab Results  Component Value Date   CREATININE 1.10 07/19/2020    No results found for: PSA  No results found for: TESTOSTERONE  No results found for: HGBA1C  Urinalysis No results found for: COLORURINE, APPEARANCEUR, LABSPEC, PHURINE, GLUCOSEU, HGBUR, BILIRUBINUR, KETONESUR, PROTEINUR, UROBILINOGEN, NITRITE, LEUKOCYTESUR  No results found for: LABMICR, WBCUA, RBCUA, LABEPIT, MUCUS, BACTERIA  Pertinent Imaging:  No results found for this or any previous visit.  No results found for this or any previous visit.  No results found for this or any previous visit.  No results found for this or any previous visit.  No results found for this or any previous visit.  No results found for this or any previous visit.  No results found for this or any previous visit.  No results found for this or any previous visit.   Assessment & Plan:    1. Erectile dysfunction, unspecified erectile dysfunction type (Primary) -  tadalafil 20mg  prn  2. Hypogonadism -testosterone labs, will call with results   No follow-ups on file.  Belvie Clara, MD  [facility] [place]

## 2024-08-29 ENCOUNTER — Other Ambulatory Visit

## 2024-08-29 ENCOUNTER — Encounter: Payer: Self-pay | Admitting: Urology

## 2024-08-29 DIAGNOSIS — E291 Testicular hypofunction: Secondary | ICD-10-CM

## 2024-08-29 NOTE — Patient Instructions (Signed)

## 2024-08-31 LAB — TESTOSTERONE,FREE AND TOTAL
Testosterone, Free: 8.8 pg/mL (ref 7.2–24.0)
Testosterone: 488 ng/dL (ref 264–916)

## 2024-09-05 ENCOUNTER — Ambulatory Visit: Payer: Self-pay | Admitting: Urology

## 2024-09-05 NOTE — Telephone Encounter (Signed)
 Letter sent

## 2024-09-08 ENCOUNTER — Telehealth: Payer: Self-pay | Admitting: Urology

## 2024-09-08 NOTE — Telephone Encounter (Signed)
 Patient returned call requesting labs results.   Test was completed on 08/29/2024.   Best number to contact patient [phone] Call transferred to clinical basket.

## 2024-09-08 NOTE — Telephone Encounter (Signed)
 Return call to pt make him aware labs routed to MD, McKenzie for review. Voiced understanding

## 2024-09-19 ENCOUNTER — Telehealth: Payer: Self-pay

## 2024-09-19 NOTE — Telephone Encounter (Signed)
Pt was made aware and voiced understanding

## 2024-09-19 NOTE — Telephone Encounter (Signed)
-----   Message from Belvie Clara sent at 09/17/2024  7:45 AM EST ----- normal ----- Message ----- From: Gretta Carlos SAUNDERS, CMA Sent: 09/08/2024  10:00 AM EST To: Belvie LITTIE Clara, MD  Please review and advise

## 2024-11-28 ENCOUNTER — Other Ambulatory Visit

## 2024-11-30 ENCOUNTER — Other Ambulatory Visit

## 2024-11-30 DIAGNOSIS — E291 Testicular hypofunction: Secondary | ICD-10-CM

## 2024-12-06 ENCOUNTER — Ambulatory Visit: Admitting: Urology
